# Patient Record
Sex: Female | Born: 1965 | Race: White | Hispanic: No | Marital: Married | Smoking: Former smoker
Health system: Southern US, Community
[De-identification: ages and names within clinical notes are randomized; demographics above are authoritative.]

## PROBLEM LIST (undated history)

## (undated) HISTORY — PX: OTHER SURGICAL HISTORY: SHX169

## (undated) HISTORY — PX: CATARACT EXTRACTION: SUR2

---

## 1999-12-30 HISTORY — PX: BREAST ENHANCEMENT SURGERY: SHX7

## 2001-07-16 ENCOUNTER — Other Ambulatory Visit: Admission: RE | Admit: 2001-07-16 | Discharge: 2001-07-16 | Payer: Self-pay | Admitting: Family Medicine

## 2003-07-18 ENCOUNTER — Ambulatory Visit (HOSPITAL_COMMUNITY): Admission: RE | Admit: 2003-07-18 | Discharge: 2003-07-18 | Payer: Self-pay | Admitting: Family Medicine

## 2004-02-01 ENCOUNTER — Encounter: Admission: RE | Admit: 2004-02-01 | Discharge: 2004-02-01 | Payer: Self-pay | Admitting: Family Medicine

## 2004-08-13 ENCOUNTER — Encounter: Admission: RE | Admit: 2004-08-13 | Discharge: 2004-08-13 | Payer: Self-pay | Admitting: *Deleted

## 2006-06-28 HISTORY — PX: KNEE SURGERY: SHX244

## 2007-07-28 ENCOUNTER — Encounter: Admission: RE | Admit: 2007-07-28 | Discharge: 2007-07-28 | Payer: Self-pay | Admitting: Family Medicine

## 2008-08-23 ENCOUNTER — Encounter: Admission: RE | Admit: 2008-08-23 | Discharge: 2008-08-23 | Payer: Self-pay | Admitting: Family Medicine

## 2009-09-17 ENCOUNTER — Encounter: Admission: RE | Admit: 2009-09-17 | Discharge: 2009-09-17 | Payer: Self-pay | Admitting: Family Medicine

## 2009-09-20 ENCOUNTER — Encounter: Admission: RE | Admit: 2009-09-20 | Discharge: 2009-09-20 | Payer: Self-pay | Admitting: Family Medicine

## 2009-09-27 ENCOUNTER — Encounter: Admission: RE | Admit: 2009-09-27 | Discharge: 2009-09-27 | Payer: Self-pay | Admitting: Family Medicine

## 2011-11-28 ENCOUNTER — Other Ambulatory Visit: Payer: Self-pay | Admitting: Family Medicine

## 2011-11-28 DIAGNOSIS — Z1231 Encounter for screening mammogram for malignant neoplasm of breast: Secondary | ICD-10-CM

## 2011-12-15 ENCOUNTER — Ambulatory Visit: Payer: Self-pay

## 2012-01-05 ENCOUNTER — Ambulatory Visit: Payer: Self-pay

## 2012-01-14 ENCOUNTER — Ambulatory Visit: Payer: Self-pay

## 2012-04-14 ENCOUNTER — Other Ambulatory Visit: Payer: Self-pay | Admitting: Family Medicine

## 2012-04-14 DIAGNOSIS — Z1231 Encounter for screening mammogram for malignant neoplasm of breast: Secondary | ICD-10-CM

## 2012-04-27 ENCOUNTER — Ambulatory Visit
Admission: RE | Admit: 2012-04-27 | Discharge: 2012-04-27 | Disposition: A | Discharge status: Home / Self Care | Payer: BC Managed Care – PPO | Source: Ambulatory Visit | Attending: Family Medicine | Admitting: Family Medicine

## 2012-04-27 DIAGNOSIS — Z1231 Encounter for screening mammogram for malignant neoplasm of breast: Secondary | ICD-10-CM

## 2013-04-13 ENCOUNTER — Other Ambulatory Visit: Payer: Self-pay

## 2013-04-13 DIAGNOSIS — Z1231 Encounter for screening mammogram for malignant neoplasm of breast: Secondary | ICD-10-CM

## 2014-10-27 ENCOUNTER — Other Ambulatory Visit: Payer: Self-pay | Admitting: Nurse Practitioner

## 2014-10-27 DIAGNOSIS — N63 Unspecified lump in unspecified breast: Secondary | ICD-10-CM

## 2014-11-09 ENCOUNTER — Ambulatory Visit
Admission: RE | Admit: 2014-11-09 | Discharge: 2014-11-09 | Disposition: A | Discharge status: Home / Self Care | Payer: PRIVATE HEALTH INSURANCE | Source: Ambulatory Visit | Attending: Nurse Practitioner | Admitting: Nurse Practitioner

## 2014-11-09 DIAGNOSIS — N63 Unspecified lump in unspecified breast: Secondary | ICD-10-CM

## 2016-02-08 ENCOUNTER — Other Ambulatory Visit: Payer: Self-pay | Admitting: Nurse Practitioner

## 2016-02-08 DIAGNOSIS — Z1231 Encounter for screening mammogram for malignant neoplasm of breast: Secondary | ICD-10-CM

## 2016-02-15 ENCOUNTER — Other Ambulatory Visit: Payer: Self-pay | Admitting: Nurse Practitioner

## 2016-02-15 DIAGNOSIS — R2232 Localized swelling, mass and lump, left upper limb: Secondary | ICD-10-CM

## 2016-02-22 ENCOUNTER — Other Ambulatory Visit: Payer: PRIVATE HEALTH INSURANCE

## 2016-04-10 ENCOUNTER — Other Ambulatory Visit: Payer: PRIVATE HEALTH INSURANCE

## 2016-04-16 ENCOUNTER — Other Ambulatory Visit: Payer: Self-pay | Admitting: Nurse Practitioner

## 2016-04-16 ENCOUNTER — Ambulatory Visit
Admission: RE | Admit: 2016-04-16 | Discharge: 2016-04-16 | Disposition: A | Discharge status: Home / Self Care | Payer: BLUE CROSS/BLUE SHIELD | Source: Ambulatory Visit | Attending: Nurse Practitioner | Admitting: Nurse Practitioner

## 2016-04-16 DIAGNOSIS — R2232 Localized swelling, mass and lump, left upper limb: Secondary | ICD-10-CM

## 2018-10-14 ENCOUNTER — Other Ambulatory Visit: Payer: Self-pay | Admitting: Nurse Practitioner

## 2018-10-14 DIAGNOSIS — Z1231 Encounter for screening mammogram for malignant neoplasm of breast: Secondary | ICD-10-CM

## 2018-11-19 ENCOUNTER — Ambulatory Visit
Admission: RE | Admit: 2018-11-19 | Discharge: 2018-11-19 | Disposition: A | Discharge status: Home / Self Care | Payer: BLUE CROSS/BLUE SHIELD | Source: Ambulatory Visit | Attending: Nurse Practitioner | Admitting: Nurse Practitioner

## 2018-11-19 DIAGNOSIS — Z1231 Encounter for screening mammogram for malignant neoplasm of breast: Secondary | ICD-10-CM

## 2019-07-16 IMAGING — MG DIGITAL SCREENING BILATERAL MAMMOGRAM WITH IMPLANTS, CAD AND TOM
8 of 12 series · 8 of 28 positions shown · non-contrast
Comparison: Previous exam(s).

CLINICAL DATA: Screening.

EXAM:
DIGITAL SCREENING BILATERAL MAMMOGRAM WITH IMPLANTS, CAD AND TOMO
The patient has retropectoral implants. Standard and implant
displaced views were performed.

[L CC]
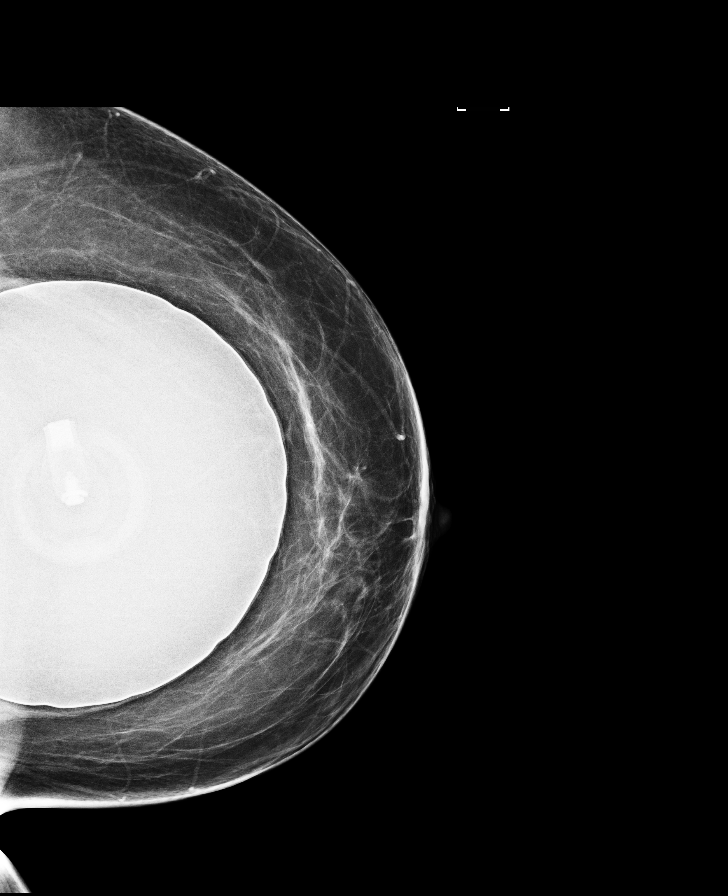

[R MLO]
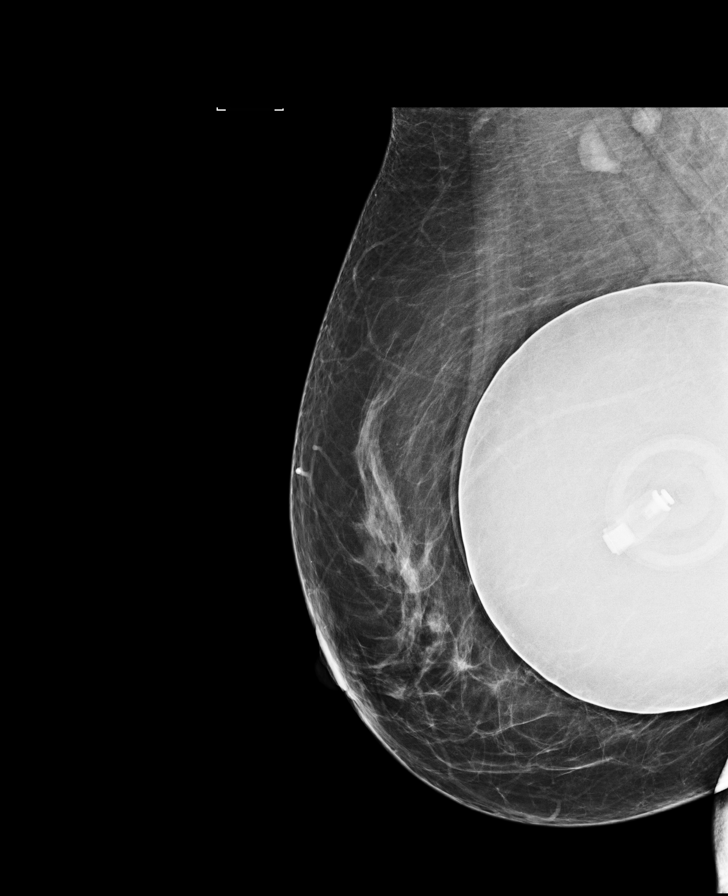

[L MLO]
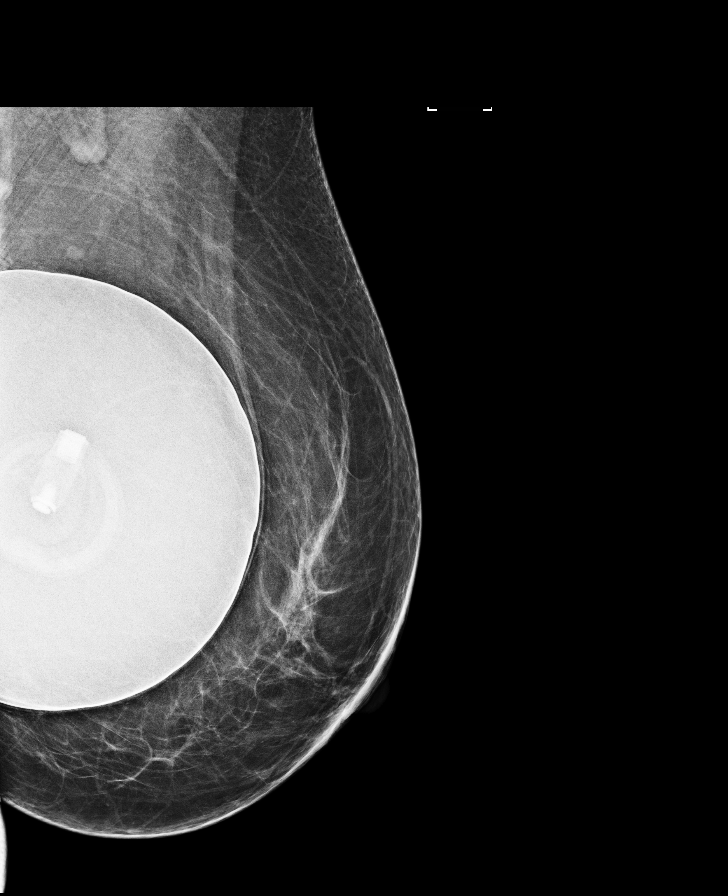

[R CC]
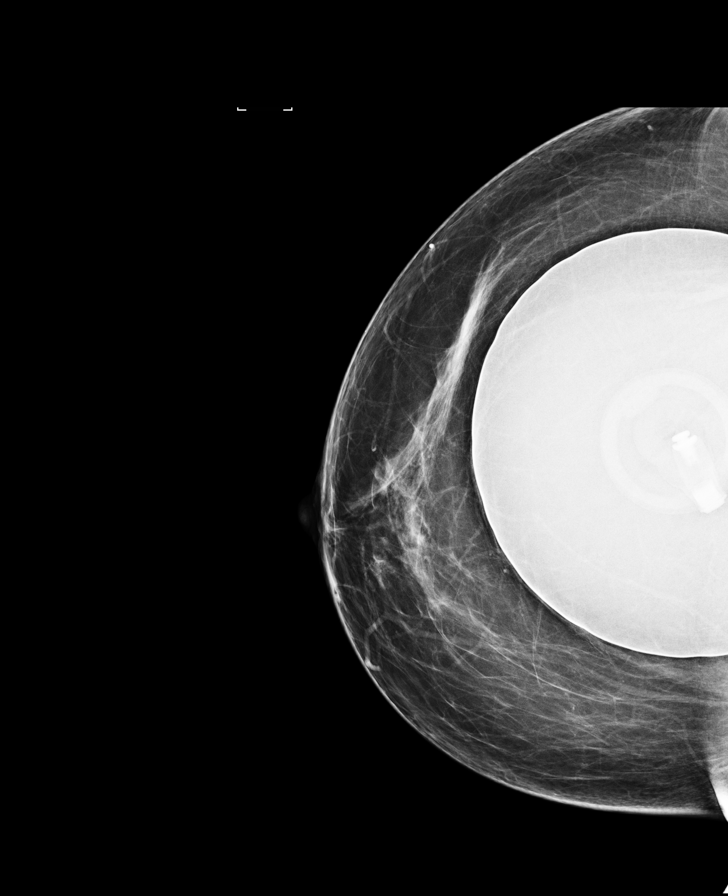

[R MLO synth-2D]
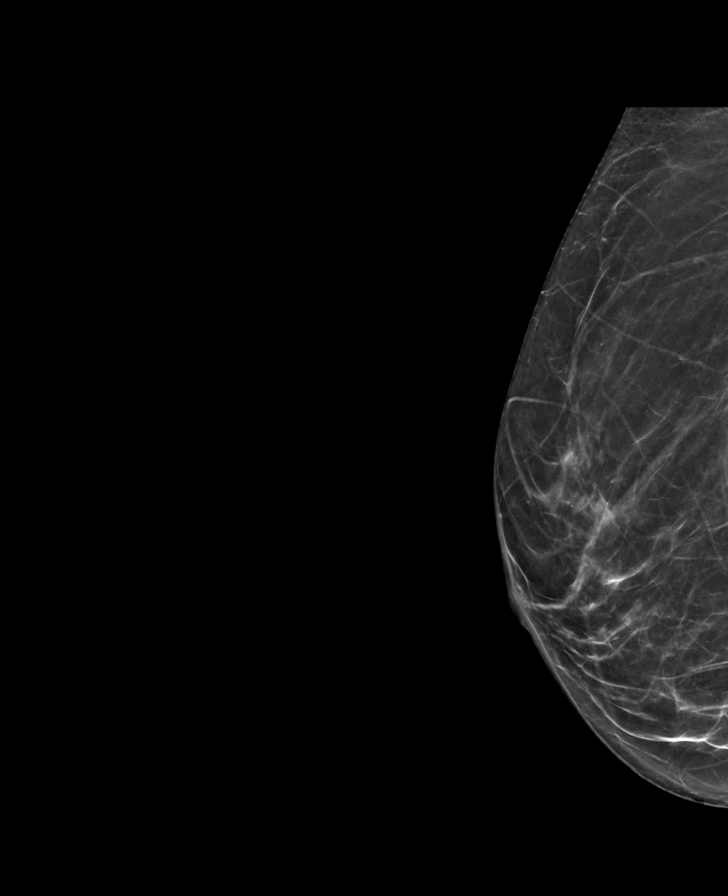

[L CC synth-2D]
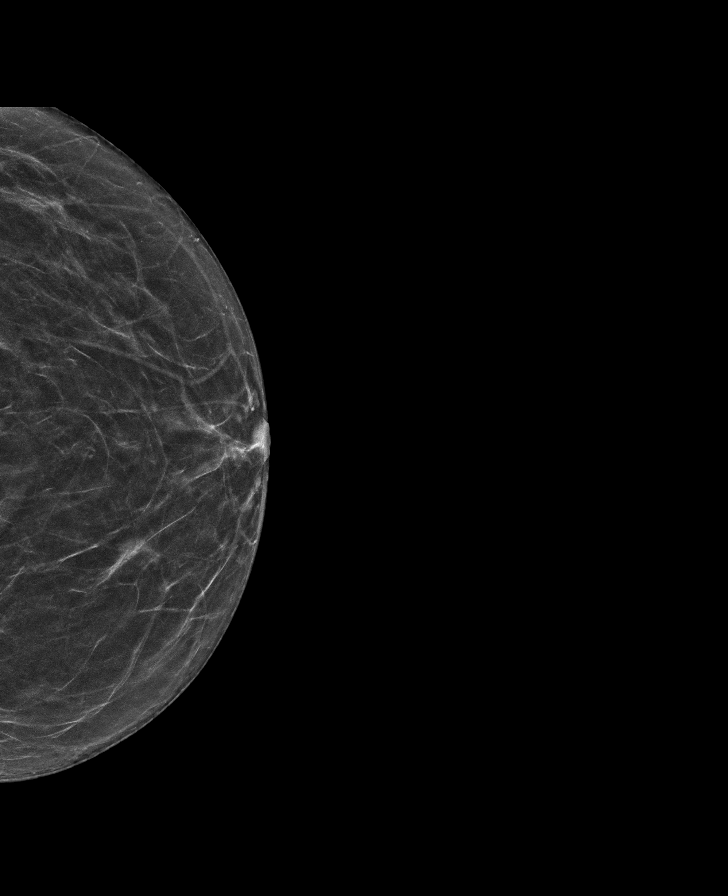

[R CC synth-2D]
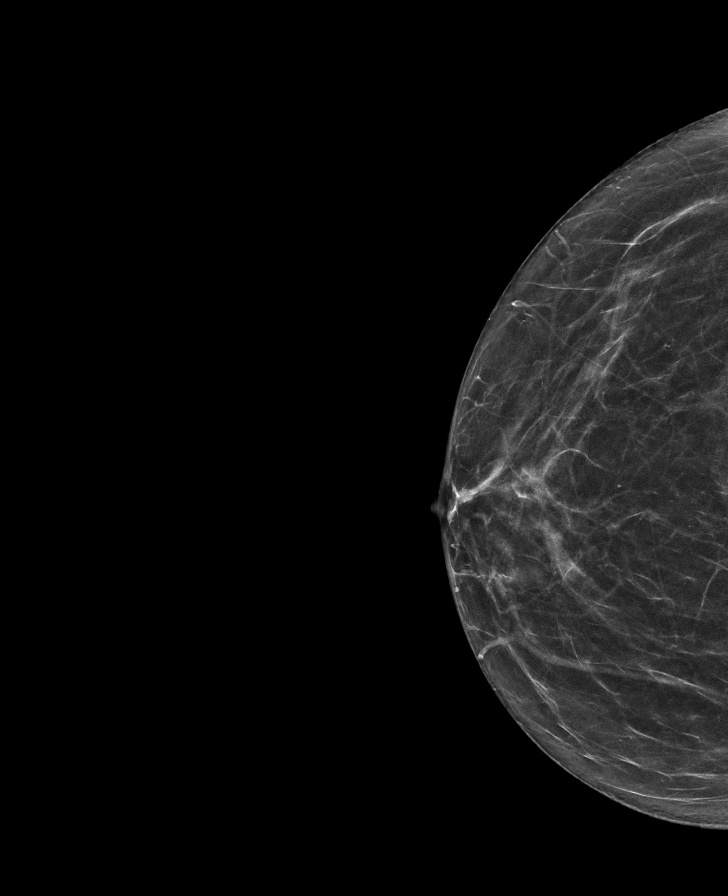

[L MLO synth-2D]
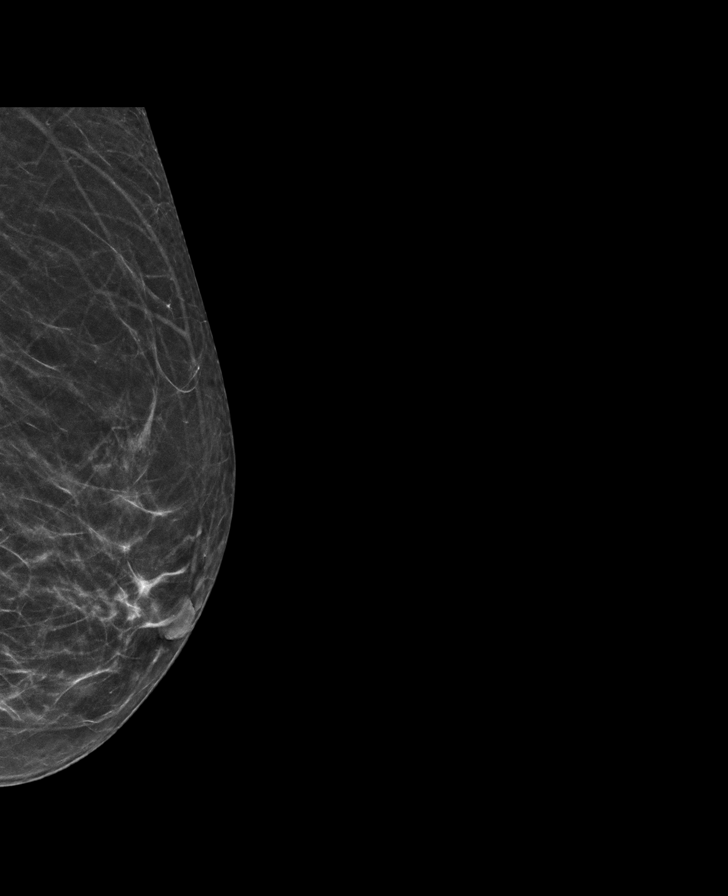

[8 of 28 positions shown; findings below may reference images not displayed]

ACR Breast Density Category b: There are scattered areas of
fibroglandular density.
FINDINGS: There are no findings suspicious for malignancy. Images were
processed with CAD.
IMPRESSION: No mammographic evidence of malignancy. A result letter of this
screening mammogram will be mailed directly to the patient.

RECOMMENDATION:
Screening mammogram in one year. (Code:60-T-8Z4)

BI-RADS CATEGORY  1:  Negative.

## 2020-04-21 LAB — COLOGUARD: COLOGUARD: NEGATIVE

## 2022-02-25 ENCOUNTER — Ambulatory Visit: Payer: Self-pay | Admitting: Family

## 2022-03-26 ENCOUNTER — Ambulatory Visit: Payer: Self-pay | Admitting: Neurology

## 2022-04-02 ENCOUNTER — Encounter: Payer: Self-pay | Admitting: Neurology

## 2022-04-02 ENCOUNTER — Ambulatory Visit: Payer: Managed Care, Other (non HMO) | Admitting: Neurology

## 2022-04-02 VITALS — BP 139/93 | HR 116 | Ht 65.0 in | Wt 182.5 lb

## 2022-04-02 DIAGNOSIS — R200 Anesthesia of skin: Secondary | ICD-10-CM | POA: Diagnosis not present

## 2022-04-02 DIAGNOSIS — M47816 Spondylosis without myelopathy or radiculopathy, lumbar region: Secondary | ICD-10-CM

## 2022-04-02 DIAGNOSIS — G249 Dystonia, unspecified: Secondary | ICD-10-CM | POA: Diagnosis not present

## 2022-04-02 DIAGNOSIS — R269 Unspecified abnormalities of gait and mobility: Secondary | ICD-10-CM | POA: Diagnosis not present

## 2022-04-02 MED ORDER — TRIHEXYPHENIDYL HCL 2 MG PO TABS: 2.0000 mg | ORAL_TABLET | Freq: Three times a day (TID) | ORAL | 5 refills | Status: AC

## 2022-04-02 NOTE — Progress Notes (Addendum)
[facility]  PATIENT: Sherry Henry DOB: 08-Mar-1966  REFERRING DOCTOR OR PCP: Marva Panda, NP; Jake Church, MD SOURCE: Patient, notes from primary care and neurology, imaging reports, MRI of the lumbar spine personally reviewed.  _________________________________   HISTORICAL  CHIEF COMPLAINT:  Chief Complaint  Patient presents with   New Patient (Initial Visit)    RM 2. Paper referral from Jake Church, MD for neuropathy/left foot drop.  Foot drop started about 5-6 years ago. Started intermittently Noticed first while on treadmill. Affecting gait/walking. Denies any injuries. Reports neuropathy, having a lot of numbness in toes/tingling. Temperature changes. No hx diabetes. Burning pain in ankle area. Having left knee pain. She has fallen twice, has a lot of near miss falls. Has seen Dr. Altha Harm back in 2021. Tried cortisone shots in lower back/ineffective.    Other    Had MRI lumbar April 2022. Brought CD to appt.  Has done PT in the past for 2 months, ineffective. Feels it actually worsened sx.     HISTORY OF PRESENT ILLNESS:  I had the pleasure of seeing patient, Sherry Henry, at [facility] for neurologic consultation regarding her left foot drop and abnormal gait  She is a 56 year old woman who reports a left foot drop and mild left > right numbness in her feet.    The foot drop started 5-6 years ago.  Initially, it was intermittent but became more constant 3 years ago.  Her foot drop seems to worsen if she walks longer or walks faster.    He used to walk 4 mils and now has trouble with a 100 feet due to the foot issues.   The husband showed me a video of her gait.  She has more of an inversion of the foot as she walks rather than an actual foot drop but the foot does rub against the ground.  She began to experience numbness in her feet 5 years ago.   It is worse if se stands or walks further.     At rest, she notes some  numbness in her toes only (especially 2-4 digits).    1 day last week, the outside of her foot was red and the top and inside was cold and normal color.  The numbness is much less of an issue than the foot weakness causing gait difficulties.  Due to the poor gait, she has had a few falls.   She caches the left foot at times on the floor and now overcompensates by swinging more.    She denies any change in bladder function.    She has had some vertigo staring last year with Covid.   She describes it as more of a bac and fort vertigo rather than rotating.    This occurs more when she is on her back or turns hr head.     She has no neck pain and no symptoms in her arms.     She had a NCV/EMG in 2021 by Salvatore Marvel.   It was read as a mild subacute to chronic Left L5 radiculopathy.  The left superficial fibular sensory nerve had no response but the peroneal and other nerves were normal.     MRI of the lumbar spine 04/03/2021 showed mild facet hypertrophy with joint effusions at L3-L4 and L4-L5 and moderate facet hypertrophy at L5-S1.  There did not appear to be nerve root compression or spinal stenosis at any of these levels. REVIEW OF SYSTEMS:  Constitutional: No fevers, chills, sweats, or change in appetite Eyes: No visual changes, double vision, eye pain Ear, nose and throat: No hearing loss, ear pain, nasal congestion, sore throat Cardiovascular: No chest pain, palpitations Respiratory:  No shortness of breath at rest or with exertion.   No wheezes GastrointestinaI: No nausea, vomiting, diarrhea, abdominal pain, fecal incontinence Genitourinary:  No dysuria, urinary retention or frequency.  No nocturia. Musculoskeletal:  No neck pain, back pain Integumentary: No rash, pruritus, skin lesions Neurological: as above Psychiatric: No depression at this time.  No anxiety Endocrine: No palpitations, diaphoresis, change in appetite, change in weigh or increased thirst Hematologic/Lymphatic:  No anemia,  purpura, petechiae. Allergic/Immunologic: No itchy/runny eyes, nasal congestion, recent allergic reactions, rashes  ALLERGIES: Allergies  Allergen Reactions   Betadine [Povidone Iodine]     Rash in eyes, eyes swollen    HOME MEDICATIONS:  Current Outpatient Medications:    albuterol (VENTOLIN HFA) 108 (90 Base) MCG/ACT inhaler, Inhale into the lungs every 6 (six) hours as needed for wheezing or shortness of breath., Disp: , Rfl:    atorvastatin (LIPITOR) 20 MG tablet, Take 20 mg by mouth daily., Disp: , Rfl:    esomeprazole (NEXIUM) 20 MG capsule, Take 20 mg by mouth daily at 12 noon., Disp: , Rfl:    lisinopril-hydrochlorothiazide (ZESTORETIC) 20-12.5 MG tablet, Take 1 tablet by mouth daily., Disp: , Rfl:    Multiple Vitamin (MULTIVITAMIN) tablet, Take 1 tablet by mouth daily., Disp: , Rfl:    trihexyphenidyl (ARTANE) 2 MG tablet, Take 1 tablet (2 mg total) by mouth 3 (three) times daily with meals., Disp: 90 tablet, Rfl: 5  PAST MEDICAL HISTORY: History reviewed. No pertinent past medical history.  PAST SURGICAL HISTORY: Past Surgical History:  Procedure Laterality Date   BREAST ENHANCEMENT SURGERY  2001   CATARACT EXTRACTION Bilateral    Left- 09/2020, right- 05/2021   CESAREAN SECTION  1995   KNEE SURGERY Left 06/2006    FAMILY HISTORY: History reviewed. No pertinent family history.  SOCIAL HISTORY:  Social History   Socioeconomic History   Marital status: Married    Spouse name: Not on file   Number of children: Not on file   Years of education: Not on file   Highest education level: Not on file  Occupational History   Not on file  Tobacco Use   Smoking status: Former    Types: Cigarettes   Smokeless tobacco: Never  Substance and Sexual Activity   Alcohol use: Yes    Comment: 5-6 drinks per week   Drug use: Never   Sexual activity: Not on file  Other Topics Concern   Not on file  Social History Narrative   Right handed    1/2-1 cup per day, soda  sometimes   Social Determinants of Health   Financial Resource Strain: Not on file  Food Insecurity: Not on file  Transportation Needs: Not on file  Physical Activity: Not on file  Stress: Not on file  Social Connections: Not on file  Intimate Partner Violence: Not on file     PHYSICAL EXAM  Vitals:   04/02/22 1023  BP: (!) 139/93  Pulse: (!) 116  Weight: 182 lb 8 oz (82.8 kg)  Height: 5\' 5"  (1.651 m)    Body mass index is 30.37 kg/m.   General: The patient is well-developed and well-nourished and in no acute distress  HEENT:  Head is [place]/AT.  Sclera are anicteric.  Funduscopic exam shows normal optic discs and  retinal vessels.  Neck: No carotid bruits are noted.  The neck is nontender.  Cardiovascular: The heart has a regular rate and rhythm with a normal S1 and S2. There were no murmurs, gallops or rubs.    Skin: Extremities are without rash or  edema.  Musculoskeletal:  Back is nontender  Neurologic Exam  Mental status: The patient is alert and oriented x 3 at the time of the examination. The patient has apparent normal recent and remote memory, with an apparently normal attention span and concentration ability.   Speech is normal.  Cranial nerves: Extraocular movements are full. Pupils are equal, round, and reactive to light and accomodation.  Visual fields are full.  Facial symmetry is present. There is good facial sensation to soft touch bilaterally.Facial strength is normal.  Trapezius and sternocleidomastoid strength is normal. No dysarthria is noted.  The tongue is midline, and the patient has symmetric elevation of the soft palate. No obvious hearing deficits are noted.  Motor:  Muscle bulk is normal.   Tone is normal. Strength is  5 / 5 in all 4 extremities.   Sensory: Sensory testing is intact to pinprick, soft touch and vibration sensation in the arms and right foot.  She has mild reduced sensation in the superficial peroneal distribution of the left  foot.  Coordination: Cerebellar testing reveals good finger-nose-finger and heel-to-shin bilaterally.  Gait and station: Station is normal.   As she walks, the left foot inverts and her toes span out.  She hits the lateral surface of the foot with some of her steps.  She is unable to tandem walk well Romberg is negative.   Reflexes: Deep tendon reflexes are symmetric and normal bilaterally.   Plantar responses are flexor.      ASSESSMENT AND PLAN  Dystonia of foot  Gait disturbance  Numbness of foot  Facet hypertrophy of lumbar region  In summary, Ms. Desautels is a 55 year old woman who has had several years of gait difficulty that has mildly progressed over the last few years.  Although she described her issue as a foot drop, she actually had a completely normal motor exam while sitting.  Specifically, strength and muscle tone was normal.  She did have some reduced sensation to pinprick in the distribution of the superficial peroneal nerve on the left though that is not affecting her gait.  When she walked she appeared to have dystonia with banding of her toes and inversion of the foot.  I discussed with her that the dystonia in her foot is similar to writer's cramp with some activities causing the abnormal movement.  She also has facet hypertrophy in the lumbar spine that might cause some back pain but there is no evidence of nerve root compression or spinal stenosis.  Trial of trihexyphenidyl 2 mg po tid and can increase dose if tolerated.  If this is not successful, try a benzodiazepine or baclofen.    If medications don't hep, consider Botox.   RTC 3 months.  Call sooner if new or worsening neurologic symptoms.  Thank you for asking me to see Ms. Nolde.  Please let me know if I can be of further assistance with her or other patients in the future.  Taneil Lazarus A. Epimenio Foot, MD, [facility] 04/02/2022, 1:14 PM Certified in Neurology, Clinical Neurophysiology, Sleep Medicine and  Neuroimaging  [facility] [address], [address] [place], [place] [address] [phone]

## 2022-05-09 ENCOUNTER — Encounter: Payer: Self-pay | Admitting: Neurology

## 2022-05-12 ENCOUNTER — Other Ambulatory Visit: Payer: Self-pay | Admitting: Neurology

## 2022-05-12 MED ORDER — BACLOFEN 10 MG PO TABS: ORAL_TABLET | ORAL | 5 refills | Status: AC

## 2022-07-30 ENCOUNTER — Encounter: Payer: Self-pay | Admitting: Neurology

## 2022-07-30 ENCOUNTER — Ambulatory Visit (INDEPENDENT_AMBULATORY_CARE_PROVIDER_SITE_OTHER): Payer: Commercial Managed Care - HMO | Admitting: Neurology

## 2022-07-30 VITALS — BP 134/98 | HR 109 | Ht 65.0 in | Wt 172.5 lb

## 2022-07-30 DIAGNOSIS — G249 Dystonia, unspecified: Secondary | ICD-10-CM | POA: Diagnosis not present

## 2022-07-30 DIAGNOSIS — G2589 Other specified extrapyramidal and movement disorders: Secondary | ICD-10-CM

## 2022-07-30 DIAGNOSIS — R200 Anesthesia of skin: Secondary | ICD-10-CM

## 2022-07-30 NOTE — Progress Notes (Signed)
[facility]  PATIENT: Sherry Henry DOB: [birth year]  REFERRING DOCTOR OR PCP: Marva Panda, NP; Jake Church, MD SOURCE: Patient, notes from primary care and neurology, imaging reports, MRI of the lumbar spine personally reviewed.  _________________________________   HISTORICAL  CHIEF COMPLAINT:  Chief Complaint  Patient presents with   Follow-up    RM 16. alone. Last seen 04/02/22. Feels oral meds ineffective. Baclofen made her very sleepy. Noticing more issues on right side, more burning in ankle area. Has noticed burning in both wrists/intermittently. This started around end May/beginning of June.     HISTORY OF PRESENT ILLNESS:  Sherry Henry, at [facility] for neurologic consultation regarding her left foot drop and abnormal gait  UPDATE 07/30/2022: Since the last visit, she tried trihexyphenidyl up to 4 g po qHS and it did not help and she felt weirs.   Baclofen was better tolerated .   She does better in Cowboy boots than regular shoes but was getting blisters.   Sometimes she uses an ankle   Due to the poor gait, she has had a few falls.   She catches the left foot at times on the floor and now overcompensates by swinging more.    She denies any change in bladder function.    She has some LBP.  She has no neck pain and no symptoms in her arms.     Dystonia history She reports a left foot drop and mild left > right numbness in her feet.    The foot drop started in 2017 and gradually worsened.  Initially, it was intermittent but became more constant 3 years ago.  Her foot drop seems to worsen if she walks longer or walks faster.    She used to walk 4 mils and now has trouble with a 100 feet due to the foot issues.   The husband showed me a video of her gait.  She has more of an inversion of the foot as she walks rather than an actual foot drop but the foot does rub against the ground.    She began to experience numbness in her  feet 5 years ago.   It is worse if se stands or walks further.     At rest, she notes some numbness in her toes only (especially 2-4 digits).    1 day last week, the outside of her foot was red and the top and inside was cold and normal color.  The numbness is much less of an issue than the foot weakness causing gait difficulties.  She had a NCV/EMG in 2021 by Sherry Henry.   It was read as a mild subacute to chronic Left L5 radiculopathy.  The left superficial fibular sensory nerve had no response but the peroneal and other nerves were normal.     MRI of the lumbar spine 04/03/2021 showed mild facet hypertrophy with joint effusions at L3-L4 and L4-L5 and moderate facet hypertrophy at L5-S1.  There did not appear to be nerve root compression or spinal stenosis at any of these levels. REVIEW OF SYSTEMS: Constitutional: No fevers, chills, sweats, or change in appetite Eyes: No visual changes, double vision, eye pain Ear, nose and throat: No hearing loss, ear pain, nasal congestion, sore throat Cardiovascular: No chest pain, palpitations Respiratory:  No shortness of breath at rest or with exertion.   No wheezes GastrointestinaI: No nausea, vomiting, diarrhea, abdominal pain, fecal incontinence Genitourinary:  No dysuria, urinary retention or frequency.  No  nocturia. Musculoskeletal:  No neck pain, back pain Integumentary: No rash, pruritus, skin lesions Neurological: as above Psychiatric: No depression at this time.  No anxiety Endocrine: No palpitations, diaphoresis, change in appetite, change in weigh or increased thirst Hematologic/Lymphatic:  No anemia, purpura, petechiae. Allergic/Immunologic: No itchy/runny eyes, nasal congestion, recent allergic reactions, rashes  ALLERGIES: Allergies  Allergen Reactions   Betadine [Povidone Iodine]     Rash in eyes, eyes swollen    HOME MEDICATIONS:  Current Outpatient Medications:    atorvastatin (LIPITOR) 20 MG tablet, Take 20 mg by mouth daily.,  Disp: , Rfl:    baclofen (LIORESAL) 10 MG tablet, 1/2 to 1 po tid, Disp: 90 each, Rfl: 5   esomeprazole (NEXIUM) 20 MG capsule, Take 20 mg by mouth daily at 12 noon., Disp: , Rfl:    lisinopril-hydrochlorothiazide (ZESTORETIC) 20-12.5 MG tablet, Take 1 tablet by mouth daily., Disp: , Rfl:    Multiple Vitamin (MULTIVITAMIN) tablet, Take 1 tablet by mouth daily., Disp: , Rfl:    trihexyphenidyl (ARTANE) 2 MG tablet, Take 1 tablet (2 mg total) by mouth 3 (three) times daily with meals., Disp: 90 tablet, Rfl: 5  PAST MEDICAL HISTORY: No past medical history on file.  PAST SURGICAL HISTORY: Past Surgical History:  Procedure Laterality Date   BREAST ENHANCEMENT SURGERY  2001   CATARACT EXTRACTION Bilateral    Left- 09/2020, right- 05/2021   CESAREAN SECTION  1995   KNEE SURGERY Left 06/2006    FAMILY HISTORY: No family history on file.  SOCIAL HISTORY:  Social History   Socioeconomic History   Marital status: Married    Spouse name: Not on file   Number of children: Not on file   Years of education: Not on file   Highest education level: Not on file  Occupational History   Not on file  Tobacco Use   Smoking status: Former    Types: Cigarettes   Smokeless tobacco: Never  Substance and Sexual Activity   Alcohol use: Yes    Comment: 5-6 drinks per week   Drug use: Never   Sexual activity: Not on file  Other Topics Concern   Not on file  Social History Narrative   Right handed    1/2-1 cup per day, soda sometimes   Social Determinants of Health   Financial Resource Strain: Not on file  Food Insecurity: Not on file  Transportation Needs: Not on file  Physical Activity: Not on file  Stress: Not on file  Social Connections: Not on file  Intimate Partner Violence: Not on file     PHYSICAL EXAM  Vitals:   07/30/22 0942  BP: (!) 134/98  Pulse: (!) 109  Weight: 172 lb 8 oz (78.2 kg)  Height: 5\' 5"  (1.651 m)     Body mass index is 28.71 kg/m.   General: The  patient is well-developed and well-nourished and in no acute distress  HEENT:  Head is [place]/AT.  Sclera are anicteric.    Skin: Extremities are without rash or  edema.  Musculoskeletal:  Back is nontender  Neurologic Exam  Mental status: The patient is alert and oriented x 3 at the time of the examination. The patient has apparent normal recent and remote memory, with an apparently normal attention span and concentration ability.   Speech is normal.  Cranial nerves: Extraocular movements are full. Facial strength is fine.  No obvious hearing deficits are noted.  Motor:  Muscle bulk is normal.   Tone is normal. Strength  is  5 / 5 in all 4 extremities.   Sensory: Sensory testing is intact to pinprick, soft touch and vibration sensation in the arms and right foot.  She has mild reduced sensation in the superficial peroneal distribution of the left foot.  Coordination: Cerebellar testing reveals good finger-nose-finger and heel-to-shin bilaterally.  Gait and station: Station is normal.   As she walks, the left foot inverts and her toes fan out. Upper leg does fine.    She hits the lateral surface of the foot with some of her steps.  She is unable to tandem walk well Romberg is negative.   Reflexes: Deep tendon reflexes are symmetric and normal bilaterally.         ASSESSMENT AND PLAN  Combined pyramidal-extrapyramidal syndrome  Dystonia of foot  Numbness of foot   As no benefit with oral agents, we will do Botox.  Return in a few weeks for Botox and then quarterly.  (Consider TP, FDL, FHL, EHL).  Call sooner if new or worsening neurologic symptoms.    Anel Creighton A. Epimenio Foot, MD, [facility] 07/30/2022, 10:22 AM Certified in Neurology, Clinical Neurophysiology, Sleep Medicine and Neuroimaging  [facility] [address] [place], [place] [address] [phone]

## 2022-08-14 ENCOUNTER — Telehealth: Payer: Self-pay

## 2022-08-14 NOTE — Telephone Encounter (Signed)
I called [employer] to start botox PA.   Spoke with Terrace Arabia who confirmed PA for EMG Guidance for limb muscles were not needed but transferred to complete the PA for the Botox Dani Gobble [id number].  I spoke with Laury Deep who advised this PA would have to be completed via fax only. Fax will be sent to # [phone] and once completed will fax back to # [phone].  REF # for the call [id number].

## 2022-08-21 NOTE — Telephone Encounter (Signed)
Reveived the paperwork for [employer] to complete for the botox. Paperwork completed and Dr Epimenio Foot signed. I have faxed this into [employer] at the fax # provided on the paper and will await determination. Received confirmation that it went through.

## 2022-09-04 NOTE — Telephone Encounter (Signed)
Called the authorization [id number], Tiffany answered and states that the Botox Berkley Harvey is still pending.  She states the ETA for it to be completed 09/06/2022 and states that by 9/11 we should have that approval/denial in.

## 2022-10-01 ENCOUNTER — Encounter: Payer: Self-pay | Admitting: Neurology

## 2022-10-02 NOTE — Telephone Encounter (Signed)
PA denied for the patient. Completed a 1st level appeal letter was written for the patient and sent to [facility] appeal fax number that was provided [phone] and received confirmation that the fax went through.

## 2022-10-29 NOTE — Telephone Encounter (Signed)
Received a letter dated 10/20/22 advising they received the appeal and will send to appeals committee which will include MD to review.  It states that this meeting will occur within 15 calender days from 10/5 which has already passed at this point. Will have to call and discuss further with someone.

## 2022-10-29 NOTE — Telephone Encounter (Signed)
Called [employer] the phone number on the back of the appeals letter. [phone] and LVM advising Melissa E who is appeals processor to call the office.

## 2022-11-04 NOTE — Telephone Encounter (Signed)
Pt will be Buy/bill, using 100U per Dr. Felecia Shelling request.

## 2022-11-04 NOTE — Telephone Encounter (Signed)
LVM advising pt that botox was approved and asking her to call back to schedule.

## 2022-11-04 NOTE — Telephone Encounter (Signed)
Called [facility] and spoke to Regan who was able to advise that they approved the botox for the pt. This will be buy and bill as it is approved under medical for the pt.  This is approved from 10/31/2022-11/01/2023 Ref ID: [id number] The ref # for the call today is [id number] Pt is ready to be scheduled, will send to our scheduling team to see about scheduling a botox apt with Dr Felecia Shelling.

## 2022-11-06 NOTE — Telephone Encounter (Signed)
Called and LVM at [phone] for pt to call office.

## 2023-01-19 ENCOUNTER — Encounter: Payer: Self-pay | Admitting: Neurology

## 2023-01-19 ENCOUNTER — Ambulatory Visit: Payer: Commercial Managed Care - HMO | Admitting: Neurology

## 2023-01-19 VITALS — BP 119/83 | HR 96 | Ht 65.0 in | Wt 173.5 lb

## 2023-01-19 DIAGNOSIS — G2589 Other specified extrapyramidal and movement disorders: Secondary | ICD-10-CM

## 2023-01-19 DIAGNOSIS — R269 Unspecified abnormalities of gait and mobility: Secondary | ICD-10-CM

## 2023-01-19 DIAGNOSIS — G249 Dystonia, unspecified: Secondary | ICD-10-CM | POA: Diagnosis not present

## 2023-01-19 MED ORDER — ONABOTULINUMTOXINA 100 UNITS IJ SOLR
100.0000 [IU] | Freq: Once | INTRAMUSCULAR | Status: AC
  Administered 2023-01-19: 70 [IU] via INTRAMUSCULAR

## 2023-01-19 NOTE — Progress Notes (Signed)
[facility]  PATIENT: Sherry Henry DOB: 04-22-1966  REFERRING DOCTOR OR PCP: Everardo Beals, NP; Sherlyn Lees, MD SOURCE: Patient, notes from primary care and neurology, imaging reports, MRI of the lumbar spine personally reviewed.  _________________________________   HISTORICAL  CHIEF COMPLAINT:  Chief Complaint  Patient presents with   Follow-up    Patient here alone in room 1. Had left knee surgery in Sept 2023. Bilateral toe numbness, burning in right ankle.     HISTORY OF PRESENT ILLNESS:  Sherry Henry, at [facility] for neurologic consultation regarding her left foot drop and abnormal gait  UPDATE 01/19/2023: Sherry Henry had a knee injury and needed surgery for the left knee on the left.    Sherry Henry then had a Bakers cyst on the left.  This was drained 15 cc and Sherry Henry got a steroid shot.    Sherry Henry still as some stiffness.    Sherry Henry is getting some shooting pain in the left lower leg.       Sherry Henry tried trihexyphenidyl  but it was not tolerated well.   Baclofen was better tolerated but had not helped.   Sherry Henry does better in Cowboy boots than regular shoes but was getting blisters.We had discussed Botox in August but due to the knee issues that was put off a few months.   Due to the poor gait, Sherry Henry has had a few falls.   Sherry Henry catches the left foot at times on the floor and now overcompensates by swinging more.    Sherry Henry denies any change in bladder function.    Sherry Henry has some LBP.  Sherry Henry has no neck pain and no symptoms in her arms.     Dystonia history Sherry Henry reports a left foot drop and mild left > right numbness in her feet.    The foot drop started in 2017 and gradually worsened.  Initially, it was intermittent but became more constant 3 years ago.  Her foot drop seems to worsen if Sherry Henry walks longer or walks faster.    Sherry Henry used to walk 4 mils and now has trouble with a 100 feet due to the foot issues.   The husband showed me a video of her gait.  Sherry Henry has more of  an inversion of the foot as Sherry Henry walks rather than an actual foot drop but the foot does rub against the ground.    Sherry Henry began to experience numbness in her feet 5 years ago.   It is worse if se stands or walks further.     At rest, Sherry Henry notes some numbness in her toes only (especially 2-4 digits).    1 day last week, the outside of her foot was red and the top and inside was cold and normal color.  The numbness is much less of an issue than the foot weakness causing gait difficulties.  Sherry Henry had a NCV/EMG in 2021 by Thom Chimes.   It was read as a mild subacute to chronic Left L5 radiculopathy.  The left superficial fibular sensory nerve had no response but the peroneal and other nerves were normal.     MRI of the lumbar spine 04/03/2021 showed mild facet hypertrophy with joint effusions at L3-L4 and L4-L5 and moderate facet hypertrophy at L5-S1.  There did not appear to be nerve root compression or spinal stenosis at any of these levels. REVIEW OF SYSTEMS: Constitutional: No fevers, chills, sweats, or change in appetite Eyes: No visual changes, double vision, eye pain Ear, nose  and throat: No hearing loss, ear pain, nasal congestion, sore throat Cardiovascular: No chest pain, palpitations Respiratory:  No shortness of breath at rest or with exertion.   No wheezes GastrointestinaI: No nausea, vomiting, diarrhea, abdominal pain, fecal incontinence Genitourinary:  No dysuria, urinary retention or frequency.  No nocturia. Musculoskeletal:  No neck pain, back pain Integumentary: No rash, pruritus, skin lesions Neurological: as above Psychiatric: No depression at this time.  No anxiety Endocrine: No palpitations, diaphoresis, change in appetite, change in weigh or increased thirst Hematologic/Lymphatic:  No anemia, purpura, petechiae. Allergic/Immunologic: No itchy/runny eyes, nasal congestion, recent allergic reactions, rashes  ALLERGIES: Allergies  Allergen Reactions   Betadine [Povidone Iodine]      Rash in eyes, eyes swollen    HOME MEDICATIONS:  Current Outpatient Medications:    aspirin 81 MG chewable tablet, Chew 81 mg by mouth daily., Disp: , Rfl:    atorvastatin (LIPITOR) 20 MG tablet, Take 20 mg by mouth daily., Disp: , Rfl:    baclofen (LIORESAL) 10 MG tablet, 1/2 to 1 po tid, Disp: 90 each, Rfl: 5   esomeprazole (NEXIUM) 20 MG capsule, Take 20 mg by mouth daily at 12 noon., Disp: , Rfl:    lisinopril-hydrochlorothiazide (ZESTORETIC) 20-12.5 MG tablet, Take 1 tablet by mouth daily., Disp: , Rfl:    Multiple Vitamin (MULTIVITAMIN) tablet, Take 1 tablet by mouth daily., Disp: , Rfl:    trihexyphenidyl (ARTANE) 2 MG tablet, Take 1 tablet (2 mg total) by mouth 3 (three) times daily with meals., Disp: 90 tablet, Rfl: 5  PAST MEDICAL HISTORY: History reviewed. No pertinent past medical history.  PAST SURGICAL HISTORY: Past Surgical History:  Procedure Laterality Date   BREAST ENHANCEMENT SURGERY  2001   CATARACT EXTRACTION Bilateral    Left- 09/2020, right- 05/2021   CESAREAN SECTION  1995   KNEE SURGERY Left 06/2006   left knee surgery Left    08/2022    FAMILY HISTORY: History reviewed. No pertinent family history.  SOCIAL HISTORY:  Social History   Socioeconomic History   Marital status: Married    Spouse name: Not on file   Number of children: Not on file   Years of education: Not on file   Highest education level: Not on file  Occupational History   Not on file  Tobacco Use   Smoking status: Former    Types: Cigarettes   Smokeless tobacco: Never  Substance and Sexual Activity   Alcohol use: Yes    Comment: 5-6 drinks per week   Drug use: Never   Sexual activity: Not on file  Other Topics Concern   Not on file  Social History Narrative   Right handed    1/2-1 cup per day, soda sometimes   Social Determinants of Health   Financial Resource Strain: Not on file  Food Insecurity: Not on file  Transportation Needs: Not on file  Physical Activity:  Not on file  Stress: Not on file  Social Connections: Not on file  Intimate Partner Violence: Not on file     PHYSICAL EXAM  Vitals:   01/19/23 1416  BP: 119/83  Pulse: 96  Weight: 173 lb 8 oz (78.7 kg)  Height: 5\' 5"  (1.651 m)     Body mass index is 28.87 kg/m.   General: The patient is well-developed and well-nourished and in no acute distress  HEENT:  Head is [place]/AT.  Sclera are anicteric.    Skin: Extremities are without rash or  edema.  Musculoskeletal:  Back is nontender  Neurologic Exam  Mental status: The patient is alert and oriented x 3 at the time of the examination. The patient has apparent normal recent and remote memory, with an apparently normal attention span and concentration ability.   Speech is normal.  Cranial nerves: Extraocular movements are full. Facial strength is fine.  No obvious hearing deficits are noted.  Motor:  Muscle bulk is normal.   Tone is normal. Strength is  5 / 5 in all 4 extremities.   Sensory: Sensory testing is intact to pinprick, soft touch and vibration sensation in the arms and right foot.  Sherry Henry has mild reduced sensation in the superficial peroneal distribution of the left foot.  Coordination: Cerebellar testing reveals good finger-nose-finger and heel-to-shin bilaterally.  Gait and station: Station is normal.   As Sherry Henry walks, the left foot inverts and her toes fan out. Upper leg does fine.    Sherry Henry hits the lateral surface of the foot with some of her steps.  Sherry Henry is unable to tandem walk well Romberg is negative.   Reflexes: Deep tendon reflexes are symmetric and normal bilaterally.         ASSESSMENT AND PLAN  Dystonia of foot  Combined pyramidal-extrapyramidal syndrome  Gait disturbance   As no benefit with oral agents, we will do Botox injections.  Botox 70 units into the left lower leg muscles:  TP (30 U), FDL (15 U), FHL (15 U), EHL (10u).  30 units wasted  Return in 3 months or call sooner if new or worsening  neurologic symptoms.    Kalel Harty A. Epimenio Foot, MD, [facility] 01/19/2023, 3:23 PM Certified in Neurology, Clinical Neurophysiology, Sleep Medicine and Neuroimaging  [facility] [address] [place], [place] [address] [phone]

## 2023-04-28 ENCOUNTER — Ambulatory Visit: Payer: Commercial Managed Care - HMO | Admitting: Neurology

## 2024-07-11 LAB — COLOGUARD: COLOGUARD: NEGATIVE

## 2024-07-15 ENCOUNTER — Encounter: Payer: Self-pay | Admitting: Advanced Practice Midwife
